# Patient Record
Sex: Male | Born: 1975 | Race: White | Hispanic: Yes | Marital: Single | Smoking: Never smoker
Health system: Southern US, Community
[De-identification: ages and names within clinical notes are randomized; demographics above are authoritative.]

---

## 1998-01-29 ENCOUNTER — Emergency Department (HOSPITAL_COMMUNITY): Admission: EM | Admit: 1998-01-29 | Discharge: 1998-01-29 | Payer: Self-pay | Admitting: Emergency Medicine

## 1998-01-29 ENCOUNTER — Encounter: Payer: Self-pay | Admitting: Emergency Medicine

## 2004-10-12 ENCOUNTER — Emergency Department (HOSPITAL_COMMUNITY): Admission: EM | Admit: 2004-10-12 | Discharge: 2004-10-12 | Payer: Self-pay | Admitting: Emergency Medicine

## 2006-04-07 ENCOUNTER — Emergency Department (HOSPITAL_COMMUNITY): Admission: EM | Admit: 2006-04-07 | Discharge: 2006-04-07 | Payer: Self-pay | Admitting: Emergency Medicine

## 2007-01-07 ENCOUNTER — Emergency Department (HOSPITAL_COMMUNITY): Admission: EM | Admit: 2007-01-07 | Discharge: 2007-01-07 | Payer: Self-pay | Admitting: Emergency Medicine

## 2010-02-18 ENCOUNTER — Emergency Department (HOSPITAL_COMMUNITY): Admission: EM | Admit: 2010-02-18 | Discharge: 2010-02-19 | Payer: Self-pay | Admitting: Emergency Medicine

## 2010-07-01 LAB — URINALYSIS, ROUTINE W REFLEX MICROSCOPIC
Bilirubin Urine: NEGATIVE
Glucose, UA: NEGATIVE mg/dL
Hgb urine dipstick: NEGATIVE
Ketones, ur: NEGATIVE mg/dL
Nitrite: NEGATIVE
Protein, ur: NEGATIVE mg/dL
Specific Gravity, Urine: 1.023 (ref 1.005–1.030)
Urobilinogen, UA: 1 mg/dL (ref 0.0–1.0)
pH: 7 (ref 5.0–8.0)

## 2011-01-29 LAB — RAPID STREP SCREEN (MED CTR MEBANE ONLY): Streptococcus, Group A Screen (Direct): NEGATIVE

## 2011-08-10 IMAGING — CT CT ABD-PELV W/O CM
2 of 4 series · 17 of 46 positions shown, 19 images · non-contrast
Comparison: None

Addendum Begins

Tiny nonspecific bibasilar lung nodules, 7 mm left and 4 mm right,
recommendation below.
If the patient is at high risk for bronchogenic carcinoma, follow-
up chest CT at 3-6 months is recommended.  If the patient is at low
risk for bronchogenic carcinoma, follow-up chest CT at 6-12 months
is recommended.  This recommendation follows the consensus
statement: Guidelines for Management of Small Pulmonary Nodules
Detected on CT Scans: A Statement from the [HOSPITAL] as
[URL]
Dr. Jumper notified of this addendum.
Addendum Ends
CLINICAL DATA: Right side back pain, left flank pain, microscopic
hematuria
CT ABDOMEN AND PELVIS WITHOUT CONTRAST
TECHNIQUE: Multidetector CT imaging of the abdomen and pelvis was
performed following the standard protocol without intravenous
contrast.

[Series 2: under 200# stone no prev · axial · 0.71mm/px · z∈[+54,+399]mm · 14 of 77 slices shown, 16 images]
[im 4/77  soft-tissue]
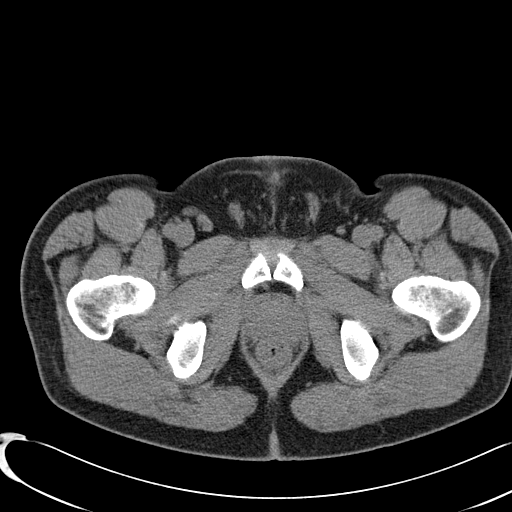
[im 4/77  bone]
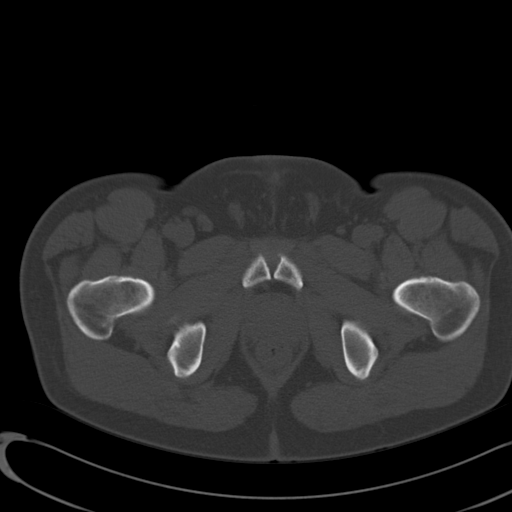
[im 10/77  soft-tissue]
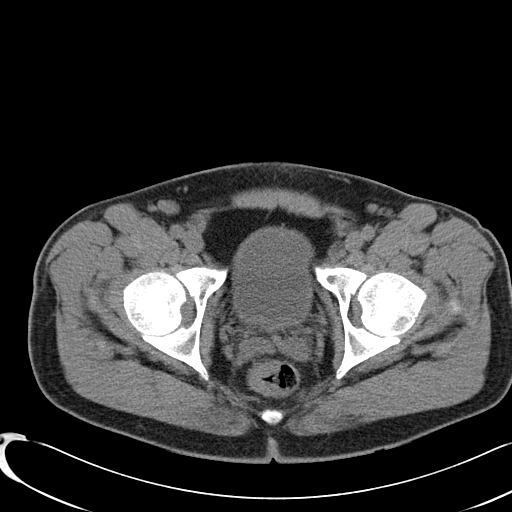
[im 16/77  soft-tissue]
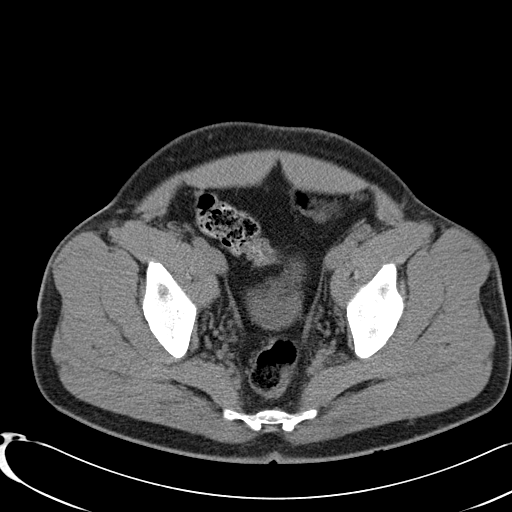
[im 22/77  soft-tissue]
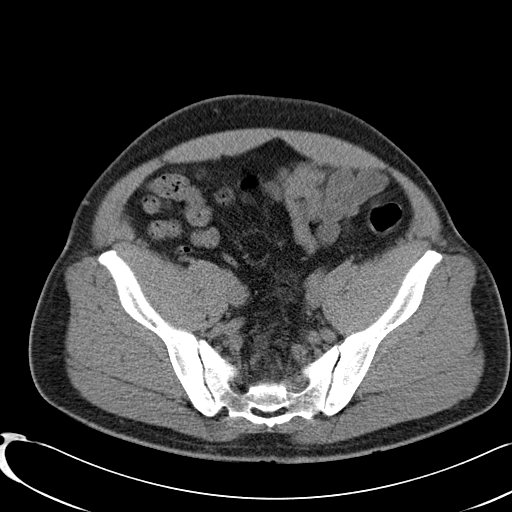
[im 25/77  soft-tissue]
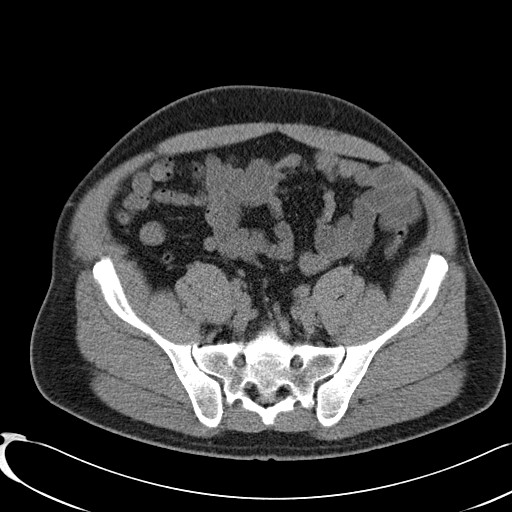
[im 31/77  soft-tissue]
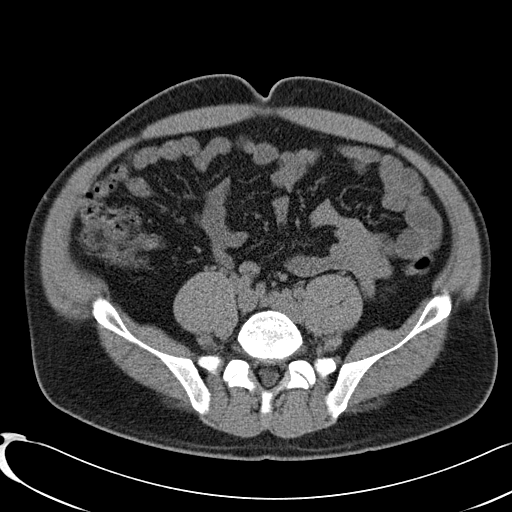
[im 37/77  soft-tissue]
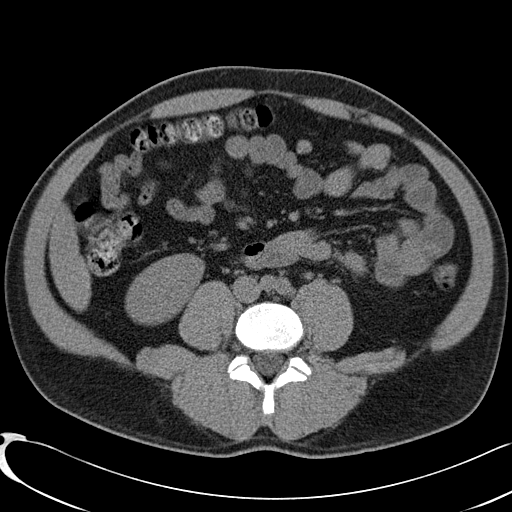
[im 40/77  soft-tissue]
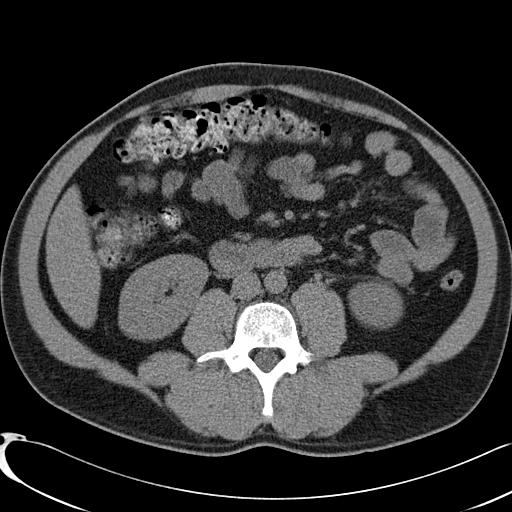
[im 46/77  soft-tissue]
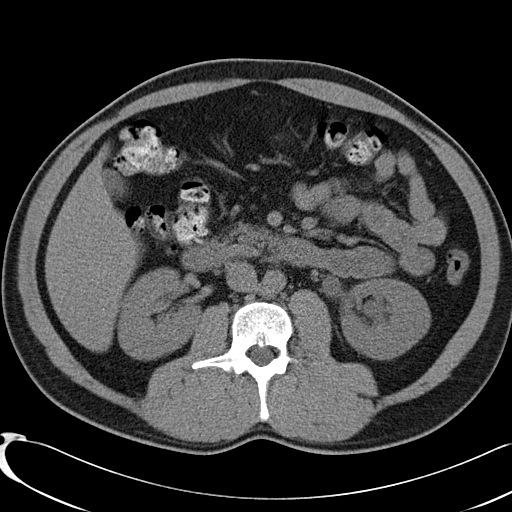
[im 46/77  bone]
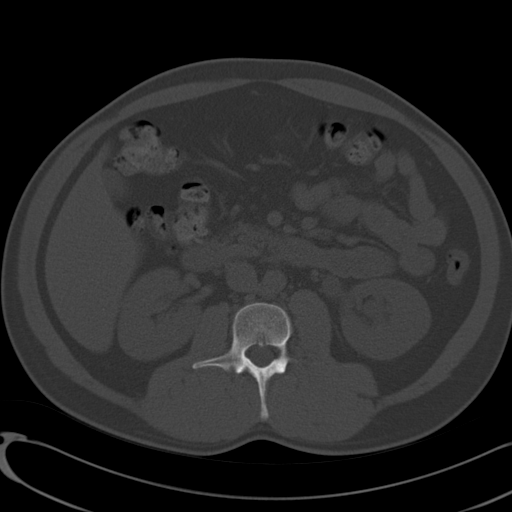
[im 52/77  soft-tissue]
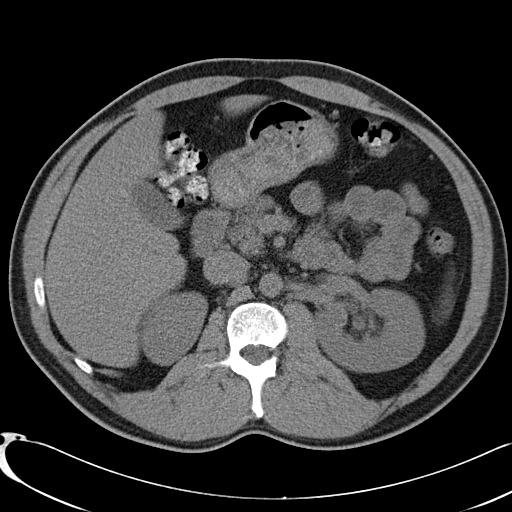
[im 58/77  soft-tissue]
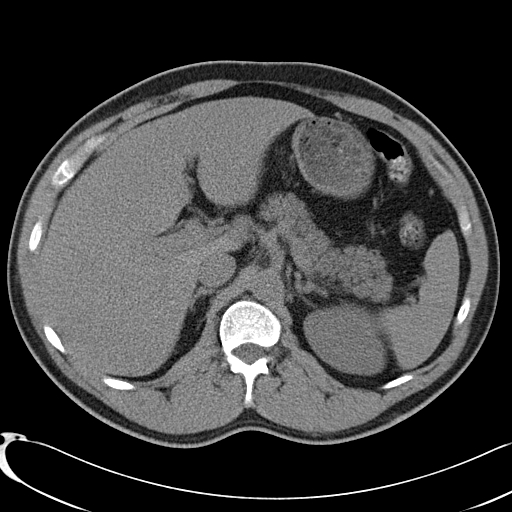
[im 61/77  soft-tissue]
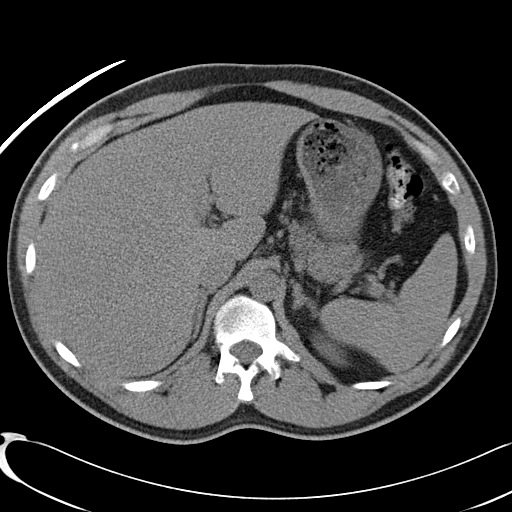
[im 67/77  soft-tissue]
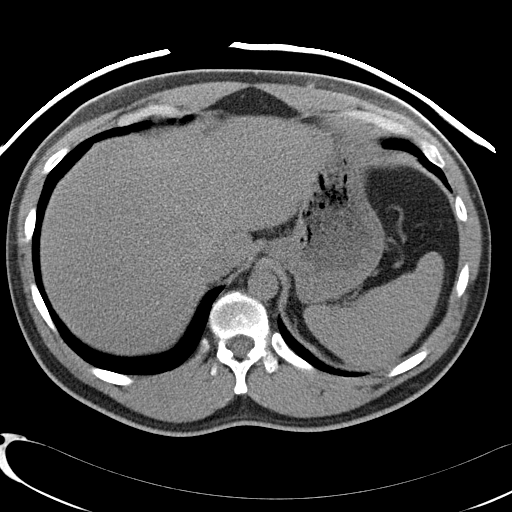
[im 73/77  soft-tissue]
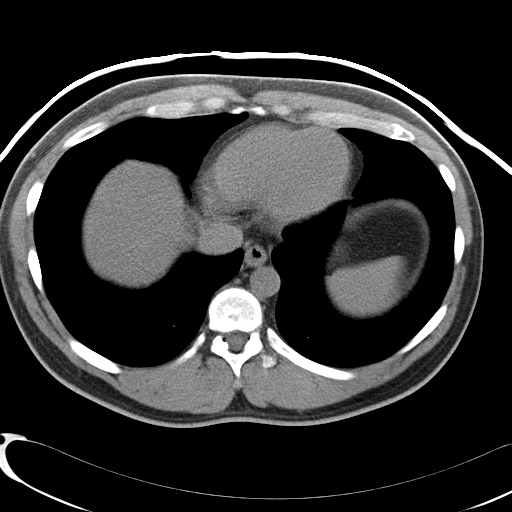

[Series 602: <mpr thick range> · coronal · 0.78mm/px · 3 of 81 slices shown]
[im 27/81  soft-tissue]
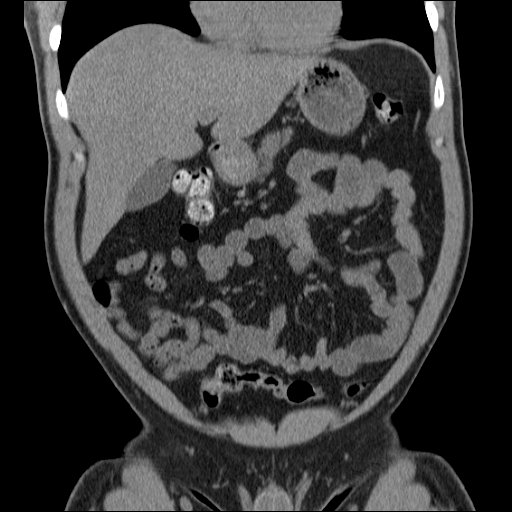
[im 36/81  soft-tissue]
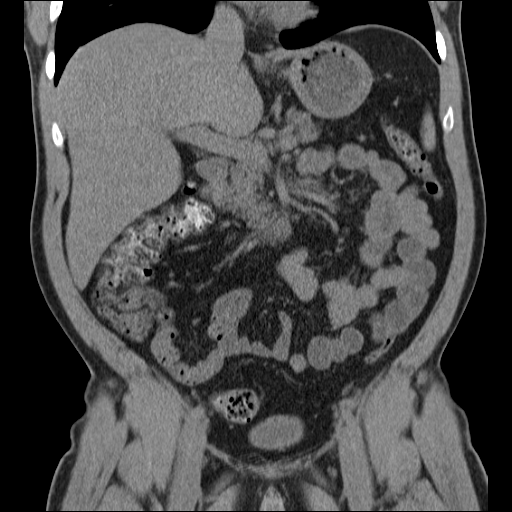
[im 45/81  soft-tissue]
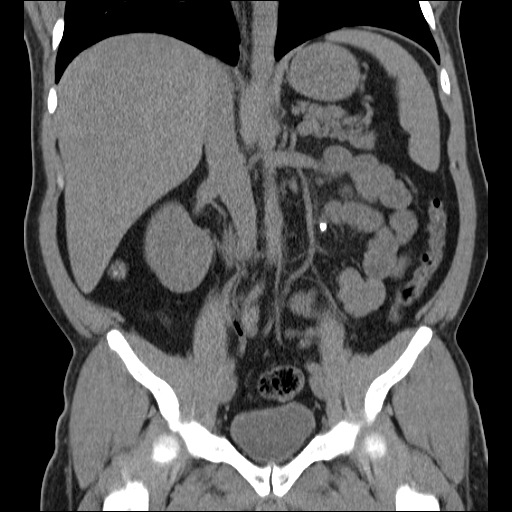

[17 of 46 positions shown; findings below may reference images not displayed]

FINDINGS: Nonspecific 7 mm diameter nodule left lung base image 9.
Left hydronephrosis secondary to a 6 mm diameter calculus at left
ureteropelvic junction.
Additional nonobstructing 6 mm diameter calculus lower pole left
kidney.
Tiny nonobstructing  mid right renal calculus image 37.
No right hydronephrosis or ureteral dilatation.
Within limits of a nonenhanced exam, no focal abnormalities of
liver, spleen, pancreas, renal glands.
No renal mass lesion identified.

Normal appendix.
Stomach and bowel loops grossly unremarkable for technique.
Bladder and distal ureters unremarkable.
No mass, adenopathy, free fluid or inflammatory process.
IMPRESSION: Left hydronephrosis secondary to a 6 mm diameter left UPJ calculus.
Additional 6 mm diameter nonobstructing left lower pole renal
calculus.
Tiny nonobstructing right renal calculus.

## 2019-01-26 ENCOUNTER — Encounter (HOSPITAL_COMMUNITY): Payer: Self-pay | Admitting: Emergency Medicine

## 2019-01-26 ENCOUNTER — Emergency Department (HOSPITAL_COMMUNITY)
Admission: EM | Admit: 2019-01-26 | Discharge: 2019-01-26 | Disposition: A | Discharge status: Home / Self Care | Payer: Self-pay | Attending: Emergency Medicine | Admitting: Emergency Medicine

## 2019-01-26 DIAGNOSIS — H1032 Unspecified acute conjunctivitis, left eye: Secondary | ICD-10-CM | POA: Insufficient documentation

## 2019-01-26 MED ORDER — FLUORESCEIN SODIUM 1 MG OP STRP
1.0000 | ORAL_STRIP | Freq: Once | OPHTHALMIC | Status: AC
  Administered 2019-01-26: 15:00:00 1 via OPHTHALMIC
  Filled 2019-01-26: qty 1

## 2019-01-26 MED ORDER — ERYTHROMYCIN 5 MG/GM OP OINT: TOPICAL_OINTMENT | OPHTHALMIC | 0 refills | Status: AC

## 2019-01-26 MED ORDER — TETRACAINE HCL 0.5 % OP SOLN
2.0000 [drp] | Freq: Once | OPHTHALMIC | Status: AC
  Administered 2019-01-26: 15:00:00 2 [drp] via OPHTHALMIC
  Filled 2019-01-26: qty 4

## 2019-01-26 NOTE — ED Notes (Signed)
Visual auity: right eye, 10/20, left eye 10/10

## 2019-01-26 NOTE — ED Triage Notes (Signed)
Pt reports eye redness and burning for the past 4 days. Pt was sent here by his PCP for further evaluation.

## 2019-01-26 NOTE — ED Provider Notes (Signed)
[facility] EMERGENCY DEPARTMENT Provider Note   CSN: [id number] Arrival date & time: 01/26/19  1127     History   Chief Complaint Chief Complaint  Patient presents with  . Burning Eyes    HPI Frank Fields is a 43 y.o. male.     HPI   43 year old male presents today with complaints of left eye irritation.  Patient notes about 3 days ago he developed redness and irritation to his left eye.  He notes watery discharge in the morning.  He notes no pain to his eye no headache neurological deficits or trauma to the eye.  Patient notes proximal 6 months ago he had something in his eye which caused similar symptoms.  He was seen by an ophthalmologist who told him he had increased intraocular pressure he was on drops for this but is no longer taking it.  He notes over the last 6 to 8 months he has had decreased vision in his left eye, this is slightly worsened with the new irritation but has not dramatically changed.  He denies any light sensitivity.  No known foreign bodies.  History reviewed. No pertinent past medical history.  There are no active problems to display for this patient.   History reviewed. No pertinent surgical history.      Home Medications    Prior to Admission medications   Medication Sig Start Date End Date Taking? Authorizing Provider  erythromycin ophthalmic ointment Place a 1/2 inch ribbon of ointment into the lower eyelid. 01/26/19   Eyvonne Mechanic, PA-C    Family History No family history on file.  Social History Social History   Tobacco Use  . Smoking status: Never Smoker  . Smokeless tobacco: Never Used  Substance Use Topics  . Alcohol use: Yes  . Drug use: Never     Allergies   Patient has no known allergies.   Review of Systems Review of Systems  All other systems reviewed and are negative.   Physical Exam Updated Vital Signs BP (!) 160/91 (BP Location: Right Arm)   Pulse 87   Temp 97.8 F (36.6 C) (Oral)    Resp 18   Ht 5\' 6"  (1.676 m)   Wt 86.2 kg   SpO2 97%   BMI 30.67 kg/m   Physical Exam Vitals signs and nursing note reviewed.  Constitutional:      Appearance: He is well-developed.  HENT:     Head: Normocephalic and atraumatic.     Comments: Bulbar conjunctival injection noted on the left with minor watery discharge, pupils equal round reactive to light, extraocular movements intact and pain-free-left intraocular pressure 30, right 23 - no fluorescein uptake, no corneal defects Eyes:     General: No scleral icterus.       Right eye: No discharge.        Left eye: No discharge.     Conjunctiva/sclera: Conjunctivae normal.     Pupils: Pupils are equal, round, and reactive to light.  Neck:     Musculoskeletal: Normal range of motion.     Vascular: No JVD.     Trachea: No tracheal deviation.  Pulmonary:     Effort: Pulmonary effort is normal.     Breath sounds: No stridor.  Neurological:     Mental Status: He is alert and oriented to person, place, and time.     Coordination: Coordination normal.  Psychiatric:        Behavior: Behavior normal.  Thought Content: Thought content normal.        Judgment: Judgment normal.      ED Treatments / Results  Labs (all labs ordered are listed, but only abnormal results are displayed) Labs Reviewed - No data to display  EKG None  Radiology No results found.  Procedures Procedures (including critical care time)  Medications Ordered in ED Medications  fluorescein ophthalmic strip 1 strip (1 strip Left Eye Given 01/26/19 1451)  tetracaine (PONTOCAINE) 0.5 % ophthalmic solution 2 drop (2 drops Both Eyes Given 01/26/19 1450)     Initial Impression / Assessment and Plan / ED Course  I have reviewed the triage vital signs and the nursing notes.  Pertinent labs & imaging results that were available during my care of the patient were reviewed by me and considered in my medical decision making (see chart for details).         43 year old male presents today with likely conjunctivitis.  Viral versus bacterial within the differential.  He has no other signs of significant eye etiology on today's visit.  He has no eye pain, his vision was improved after tetracaine, he does have decreased vision on that eye but this is baseline.  He had no significant elevation in intra-ocular pressure.  He has no signs of iritis glaucoma, or any other acute life-threatening or vision threatening etiology at this time.  Patient will be started on erythromycin, discharged with outpatient ophthalmology follow-up and return precautions.  Verbalized understanding and agreement to today's plan.  Final Clinical Impressions(s) / ED Diagnoses   Final diagnoses:  Acute conjunctivitis of left eye, unspecified acute conjunctivitis type    ED Discharge Orders         Ordered    erythromycin ophthalmic ointment     01/26/19 1546           Okey Regal, PA-C 01/26/19 [place], MD 01/27/19 [phone]

## 2019-01-26 NOTE — ED Notes (Signed)
Pt ambulated to RR .  

## 2019-01-26 NOTE — Discharge Instructions (Signed)
Please read attached information. If you experience any new or worsening signs or symptoms please return to the emergency room for evaluation. Please follow-up with your primary care provider or specialist as discussed. Please use medication prescribed only as directed and discontinue taking if you have any concerning signs or symptoms.   °
